# Patient Record
Sex: Female | Born: 1985 | Race: White | Hispanic: No | Marital: Married | Smoking: Never smoker
Health system: Southern US, Community
[De-identification: ages and names within clinical notes are randomized; demographics above are authoritative.]

---

## 2015-12-05 ENCOUNTER — Emergency Department
Admission: EM | Admit: 2015-12-05 | Discharge: 2015-12-05 | Disposition: A | Discharge status: Home / Self Care | Payer: No Typology Code available for payment source | Attending: Emergency Medicine | Admitting: Emergency Medicine

## 2015-12-05 ENCOUNTER — Encounter: Payer: Self-pay | Admitting: Emergency Medicine

## 2015-12-05 ENCOUNTER — Emergency Department: Payer: No Typology Code available for payment source

## 2015-12-05 DIAGNOSIS — T07XXXA Unspecified multiple injuries, initial encounter: Secondary | ICD-10-CM

## 2015-12-05 DIAGNOSIS — Z3A28 28 weeks gestation of pregnancy: Secondary | ICD-10-CM | POA: Insufficient documentation

## 2015-12-05 DIAGNOSIS — Y92411 Interstate highway as the place of occurrence of the external cause: Secondary | ICD-10-CM | POA: Diagnosis not present

## 2015-12-05 DIAGNOSIS — Z331 Pregnant state, incidental: Secondary | ICD-10-CM | POA: Diagnosis not present

## 2015-12-05 DIAGNOSIS — Y9389 Activity, other specified: Secondary | ICD-10-CM | POA: Insufficient documentation

## 2015-12-05 DIAGNOSIS — T148 Other injury of unspecified body region: Secondary | ICD-10-CM | POA: Diagnosis not present

## 2015-12-05 DIAGNOSIS — S60922A Unspecified superficial injury of left hand, initial encounter: Secondary | ICD-10-CM | POA: Diagnosis present

## 2015-12-05 DIAGNOSIS — Y999 Unspecified external cause status: Secondary | ICD-10-CM | POA: Diagnosis not present

## 2015-12-05 DIAGNOSIS — T148XXA Other injury of unspecified body region, initial encounter: Secondary | ICD-10-CM

## 2015-12-05 DIAGNOSIS — S60222A Contusion of left hand, initial encounter: Secondary | ICD-10-CM | POA: Diagnosis not present

## 2015-12-05 DIAGNOSIS — S61432A Puncture wound without foreign body of left hand, initial encounter: Secondary | ICD-10-CM | POA: Diagnosis not present

## 2015-12-05 MED ORDER — HYDROCODONE-ACETAMINOPHEN 5-325 MG PO TABS: 1.0000 | ORAL_TABLET | Freq: Four times a day (QID) | ORAL | Status: AC | PRN

## 2015-12-05 MED ORDER — BACITRACIN ZINC 500 UNIT/GM EX OINT
TOPICAL_OINTMENT | Freq: Once | CUTANEOUS | Status: AC
  Administered 2015-12-05: 1 via TOPICAL
  Filled 2015-12-05: qty 0.9

## 2015-12-05 MED ORDER — ONDANSETRON 4 MG PO TBDP
4.0000 mg | ORAL_TABLET | Freq: Once | ORAL | Status: AC
  Administered 2015-12-05: 4 mg via ORAL
  Filled 2015-12-05: qty 1

## 2015-12-05 MED ORDER — HYDROCODONE-ACETAMINOPHEN 5-325 MG PO TABS
1.0000 | ORAL_TABLET | Freq: Once | ORAL | Status: AC
  Administered 2015-12-05: 1 via ORAL
  Filled 2015-12-05: qty 1
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
## 2017-11-23 IMAGING — CR DG FOREARM 2V*L*
1 series · 2 of 2 positions shown · non-contrast
Comparison: None.

CLINICAL DATA: Pain and swelling. Laceration. Concern for soft
tissue foreign body.

EXAM:
LEFT FOREARM - 2 VIEW

[Series 1: dg forearm left · 0.14mm/px · 2 of 2 slices shown]
[im 1/2]
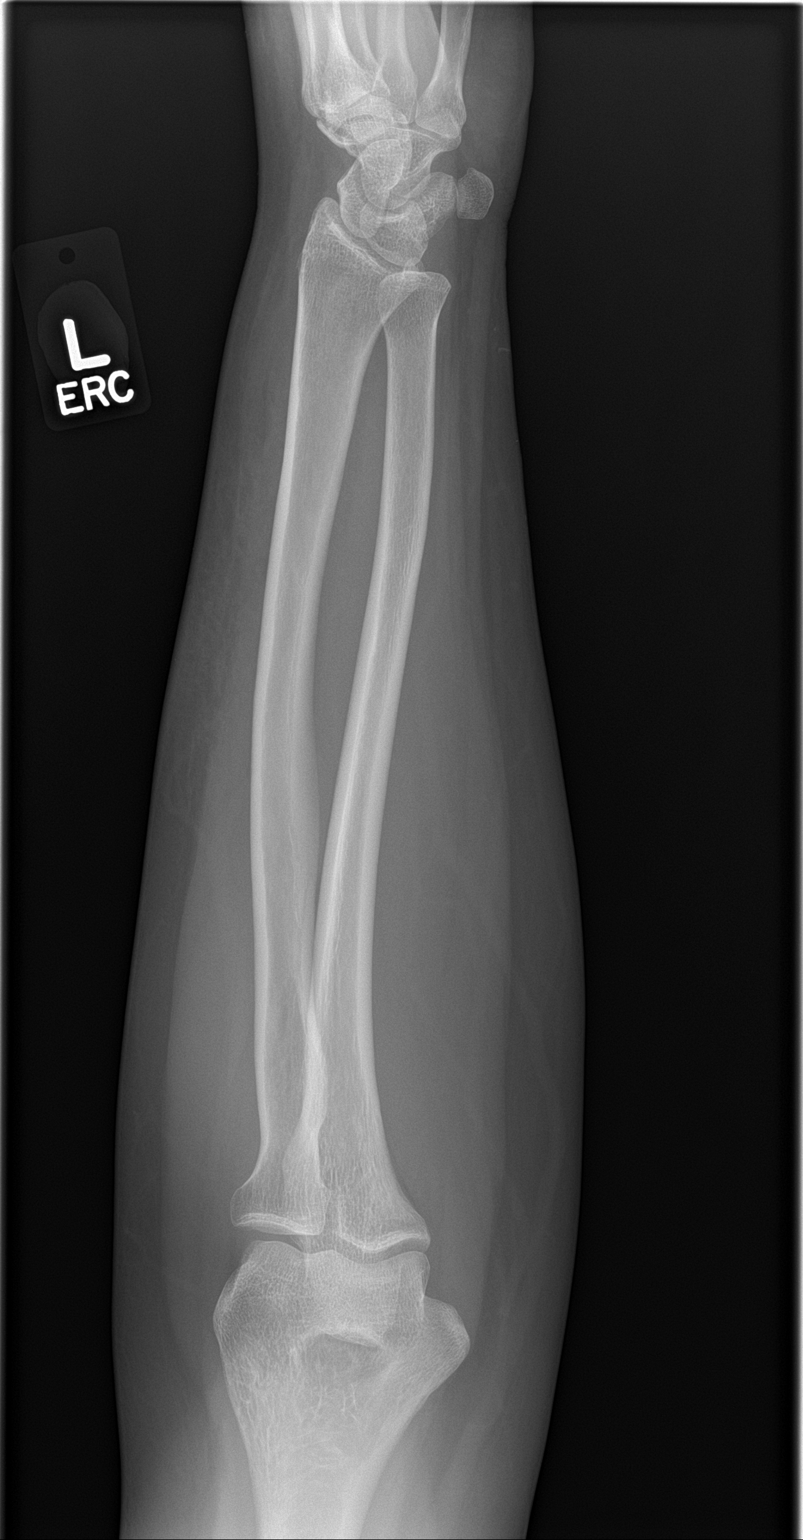
[im 2/2]
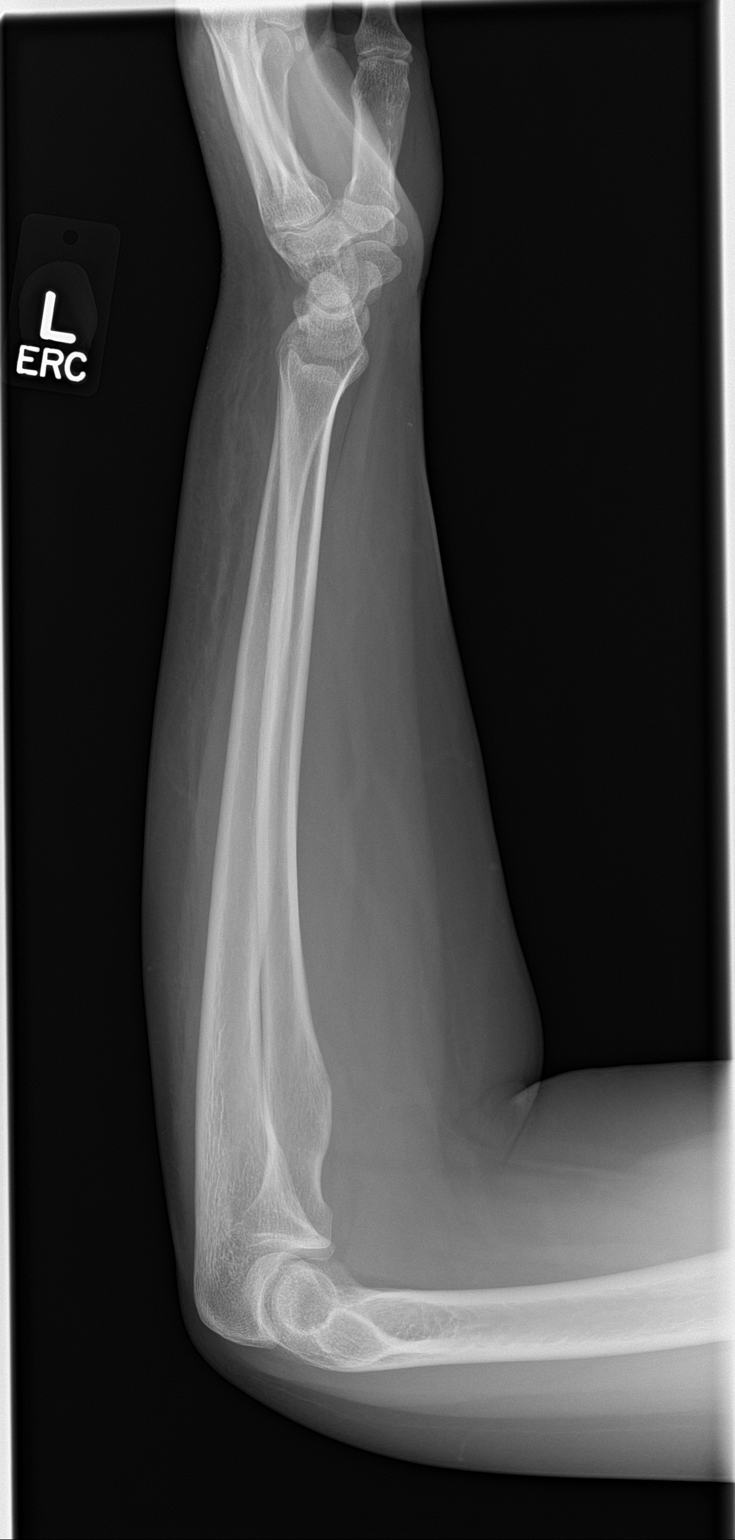

[2 of 2 positions shown; findings below may reference images not displayed]

FINDINGS: Negative for acute fracture, dislocation or radiopaque foreign body.
IMPRESSION: Negative.
